# Patient Record
Sex: Male | Born: 1980 | Race: White | Hispanic: No | Marital: Single | Smoking: Never smoker
Health system: Southern US, Community
[De-identification: ages and names within clinical notes are randomized; demographics above are authoritative.]

## PROBLEM LIST (undated history)

## (undated) HISTORY — PX: HERNIA REPAIR: SHX51

---

## 2011-06-06 ENCOUNTER — Emergency Department (HOSPITAL_BASED_OUTPATIENT_CLINIC_OR_DEPARTMENT_OTHER): Payer: BC Managed Care – PPO

## 2011-06-06 ENCOUNTER — Emergency Department (HOSPITAL_BASED_OUTPATIENT_CLINIC_OR_DEPARTMENT_OTHER)
Admission: EM | Admit: 2011-06-06 | Discharge: 2011-06-06 | Disposition: A | Discharge status: Home / Self Care | Payer: BC Managed Care – PPO | Attending: Emergency Medicine | Admitting: Emergency Medicine

## 2011-06-06 ENCOUNTER — Emergency Department (INDEPENDENT_AMBULATORY_CARE_PROVIDER_SITE_OTHER): Payer: BC Managed Care – PPO

## 2011-06-06 ENCOUNTER — Encounter (HOSPITAL_BASED_OUTPATIENT_CLINIC_OR_DEPARTMENT_OTHER): Payer: Self-pay | Admitting: *Deleted

## 2011-06-06 DIAGNOSIS — R42 Dizziness and giddiness: Secondary | ICD-10-CM | POA: Insufficient documentation

## 2011-06-06 DIAGNOSIS — R10819 Abdominal tenderness, unspecified site: Secondary | ICD-10-CM

## 2011-06-06 DIAGNOSIS — R197 Diarrhea, unspecified: Secondary | ICD-10-CM

## 2011-06-06 DIAGNOSIS — R109 Unspecified abdominal pain: Secondary | ICD-10-CM | POA: Insufficient documentation

## 2011-06-06 DIAGNOSIS — R112 Nausea with vomiting, unspecified: Secondary | ICD-10-CM | POA: Insufficient documentation

## 2011-06-06 DIAGNOSIS — R1110 Vomiting, unspecified: Secondary | ICD-10-CM

## 2011-06-06 LAB — URINALYSIS, ROUTINE W REFLEX MICROSCOPIC
Ketones, ur: >80 mg/dL — AB
Nitrite: NEGATIVE
Protein, ur: 100 mg/dL — AB
Urobilinogen, UA: 1 mg/dL (ref 0.0–1.0)

## 2011-06-06 LAB — CBC
HCT: 44.4 % (ref 39.0–52.0)
MCH: 33.4 pg (ref 26.0–34.0)
MCV: 91.5 fL (ref 78.0–100.0)
RBC: 4.85 MIL/uL (ref 4.22–5.81)
WBC: 11.3 10*3/uL — ABNORMAL HIGH (ref 4.0–10.5)

## 2011-06-06 LAB — URINE MICROSCOPIC-ADD ON

## 2011-06-06 LAB — COMPREHENSIVE METABOLIC PANEL
BUN: 22 mg/dL (ref 6–23)
CO2: 22 mEq/L (ref 19–32)
Chloride: 99 mEq/L (ref 96–112)
Creatinine, Ser: 0.9 mg/dL (ref 0.50–1.35)
GFR calc Af Amer: >90 mL/min (ref >90)
GFR calc non Af Amer: >90 mL/min (ref >90)
Total Bilirubin: 3.6 mg/dL — ABNORMAL HIGH (ref 0.3–1.2)

## 2011-06-06 LAB — LIPASE, BLOOD: Lipase: 31 U/L (ref 11–59)

## 2011-06-06 MED ORDER — SODIUM CHLORIDE 0.9 % IV BOLUS (SEPSIS)
2000.0000 mL | Freq: Once | INTRAVENOUS | Status: AC
  Administered 2011-06-06: 1000 mL via INTRAVENOUS

## 2011-06-06 MED ORDER — ONDANSETRON HCL 4 MG PO TABS: 8.0000 mg | ORAL_TABLET | Freq: Four times a day (QID) | ORAL | Status: AC

## 2011-06-06 MED ORDER — ONDANSETRON HCL 4 MG/2ML IJ SOLN
4.0000 mg | Freq: Once | INTRAMUSCULAR | Status: AC
  Administered 2011-06-06: 4 mg via INTRAVENOUS
  Filled 2011-06-06: qty 2

## 2011-06-06 NOTE — Discharge Instructions (Signed)
Diet for Diarrhea, Adult Having frequent, runny stools (diarrhea) has many causes. Diarrhea may be caused or worsened by food or drink. Diarrhea may be relieved by changing your diet. IF YOU ARE NOT TOLERATING SOLID FOODS:  Drink enough water and fluids to keep your urine clear or pale yellow.   Avoid sugary drinks and sodas as well as milk-based beverages.   Avoid beverages containing caffeine and alcohol.   You may try rehydrating beverages. You can make your own by following this recipe:    tsp table salt.    tsp baking soda.   ? tsp salt substitute (potassium chloride).   1 tbs + 1 tsp sugar.   1 qt water.  As your stools become more solid, you can start eating solid foods. Add foods one at a time. If a certain food causes your diarrhea to get worse, avoid that food and try other foods. A low fiber, low-fat, and lactose-free diet is recommended. Small, frequent meals may be better tolerated.  Starches  Allowed:  White, [place], and pita breads, plain rolls, buns, bagels. Plain muffins, matzo. Soda, saltine, or graham crackers. Pretzels, melba toast, zwieback. Cooked cereals made with water: cornmeal, farina, cream cereals. Dry cereals: refined corn, wheat, rice. Potatoes prepared any way without skins, refined macaroni, spaghetti, noodles, refined rice.   Avoid:  Bread, rolls, or crackers made with whole wheat, multi-grains, rye, bran seeds, nuts, or coconut. Corn tortillas or taco shells. Cereals containing whole grains, multi-grains, bran, coconut, nuts, or raisins. Cooked or dry oatmeal. Coarse wheat cereals, granola. Cereals advertised as "high-fiber." Potato skins. Whole grain pasta, wild or brown rice. Popcorn. Sweet potatoes/yams. Sweet rolls, doughnuts, waffles, pancakes, sweet breads.  Vegetables  Allowed: Strained tomato and vegetable juices. Most well-cooked and canned vegetables without seeds. Fresh: Tender lettuce, cucumber without the skin, cabbage, spinach, bean  sprouts.   Avoid: Fresh, cooked, or canned: Artichokes, baked beans, beet greens, broccoli, Brussels sprouts, corn, kale, legumes, peas, sweet potatoes. Cooked: Green or red cabbage, spinach. Avoid large servings of any vegetables, because vegetables shrink when cooked, and they contain more fiber per serving than fresh vegetables.  Fruit  Allowed: All fruit juices except prune juice. Cooked or canned: Apricots, applesauce, cantaloupe, cherries, fruit cocktail, grapefruit, grapes, kiwi, mandarin oranges, peaches, pears, plums, watermelon. Fresh: Apples without skin, ripe banana, grapes, cantaloupe, cherries, grapefruit, peaches, oranges, plums. Keep servings limited to  cup or 1 piece.   Avoid: Fresh: Apple with skin, apricots, mango, pears, raspberries, strawberries. Prune juice, stewed or dried prunes. Dried fruits, raisins, dates. Large servings of all fresh fruits.  Meat and Meat Substitutes  Allowed: Ground or well-cooked tender beef, ham, veal, lamb, pork, or poultry. Eggs, plain cheese. Fish, oysters, shrimp, lobster, other seafoods. Liver, organ meats.   Avoid: Tough, fibrous meats with gristle. Peanut butter, smooth or chunky. Cheese, nuts, seeds, legumes, dried peas, beans, lentils.  Milk  Allowed: Yogurt, lactose-free milk, kefir, drinkable yogurt, buttermilk, soy milk.   Avoid: Milk, chocolate milk, beverages made with milk, such as milk shakes.  Soups  Allowed: Bouillon, broth, or soups made from allowed foods. Any strained soup.   Avoid: Soups made from vegetables that are not allowed, cream or milk-based soups.  Desserts and Sweets  Allowed: Sugar-free gelatin, sugar-free frozen ice pops made without sugar alcohol.   Avoid: Plain cakes and cookies, pie made with allowed fruit, pudding, custard, cream pie. Gelatin, fruit, ice, sherbet, frozen ice pops. Ice cream, ice milk without nuts. Plain hard candy,  honey, jelly, molasses, syrup, sugar, chocolate syrup, gumdrops,  marshmallows.  Fats and Oils  Allowed: Avoid any fats and oils.   Avoid: Seeds, nuts, olives, avocados. Margarine, butter, cream, mayonnaise, salad oils, plain salad dressings made from allowed foods. Plain gravy, crisp bacon without rind.  Beverages  Allowed: Water, decaffeinated teas, oral rehydration solutions, sugar-free beverages.   Avoid: Fruit juices, caffeinated beverages (coffee, tea, soda or pop), alcohol, sports drinks, or lemon-lime soda or pop.  Condiments  Allowed: Ketchup, mustard, horseradish, vinegar, cream sauce, cheese sauce, cocoa powder. Spices in moderation: allspice, basil, bay leaves, celery powder or leaves, cinnamon, cumin powder, curry powder, ginger, mace, marjoram, onion or garlic powder, oregano, paprika, parsley flakes, ground pepper, rosemary, sage, savory, tarragon, thyme, turmeric.   Avoid: Coconut, honey.  Weight Monitoring: Weigh yourself every day. You should weigh yourself in the morning after you urinate and before you eat breakfast. Wear the same amount of clothing when you weigh yourself. Record your weight daily. Bring your recorded weights to your clinic visits. Tell your caregiver right away if you have gained 3 lb/1.4 kg or more in 1 day, 5 lb/2.3 kg in a week, or whatever amount you were told to report. SEEK IMMEDIATE MEDICAL CARE IF:   You are unable to keep fluids down.   You start to throw up (vomit) or diarrhea keeps coming back (persistent).   Abdominal pain develops, increases, or can be felt in one place (localizes).   You have an oral temperature above 102 F (38.9 C), not controlled by medicine.   Diarrhea contains blood or mucus.   You develop excessive weakness, dizziness, fainting, or extreme thirst.  MAKE SURE YOU:   Understand these instructions.   Will watch your condition.   Will get help right away if you are not doing well or get worse.  Document Released: 05/25/2003 Document Revised: 02/21/2011 Document Reviewed:  09/15/2008 [facility] Patient Information 2012 [place], [place].   Your blood sugar today was 144  Which is mildly be elevated. You can take Imodium AD as directed for diarrhea. Call Dr. Clarene Duke to arrange to be seen in the office next week regarding her blood sugar. Return if your condition worsens for any reason

## 2011-06-06 NOTE — ED Notes (Addendum)
Vomiting  And diarrhea x 1 day- took phenergan without relief- pt went to walk-in clinic and had phenergan 50mg  IM given at 1430

## 2011-06-06 NOTE — ED Provider Notes (Addendum)
History     CSN: [id number]  Arrival date & time 06/06/11  1710   First MD Initiated Contact with Patient 06/06/11 1838      Chief Complaint  Patient presents with  . Emesis    (Consider location/radiation/quality/duration/timing/severity/associated sxs/prior treatment) HPI Complains of low abdominnal pain several episodes of vomiting and several episodes of diarrhea onset this morning. No blood per rectum no hematemesis. Abdominal pain is worse with vomiting not improved by anything is mild to moderate present. Treated with Phenergan, no relief. Continues to vomit. Seen at walk-in clinic earlier today sent here for further evaluation. Associated symptoms include lightheadedness. Pain is sharp in quality and also crampy History reviewed. No pertinent past medical history. Past medical history negative Past Surgical History  Procedure Date  . Hernia repair     No family history on file.  History  Substance Use Topics  . Smoking status: Never Smoker   . Smokeless tobacco: Never Used  . Alcohol Use: Yes     rare      Review of Systems  Constitutional: Positive for appetite change and fatigue.  HENT: Negative.   Respiratory: Negative.   Cardiovascular: Negative.   Gastrointestinal: Positive for nausea, vomiting, abdominal pain and diarrhea.  Musculoskeletal: Negative.   Skin: Negative.   Neurological: Negative.   Hematological: Negative.   Psychiatric/Behavioral: Negative.     Allergies  Benadryl and Erythromycin  Home Medications   Current Outpatient Rx  Name Route Sig Dispense Refill  . PROMETHAZINE HCL 25 MG PO TABS Oral Take 25 mg by mouth every 6 (six) hours as needed.      BP 106/77  Pulse 108  Temp(Src) 98.3 F (36.8 C) (Oral)  Resp 20  Ht 6\' 5"  (1.956 m)  Wt 225 lb (102.059 kg)  BMI 26.68 kg/m2  SpO2 98%  Physical Exam  Nursing note and vitals reviewed. Constitutional: He appears well-developed and well-nourished.  HENT:  Head:  Normocephalic and atraumatic.  Eyes: Conjunctivae are normal. Pupils are equal, round, and reactive to light.  Neck: Neck supple. No tracheal deviation present. No thyromegaly present.  Cardiovascular: Normal rate and regular rhythm.   No murmur heard. Pulmonary/Chest: Effort normal and breath sounds normal.  Abdominal: Soft. He exhibits no distension and no mass. There is tenderness. There is rebound. There is no guarding.       Periumbilical tenderness, left lower quadrant tenderness, right lower quadrant tenderness  Genitourinary: Penis normal.  Musculoskeletal: Normal range of motion. He exhibits no edema and no tenderness.  Neurological: He is alert. Coordination normal.  Skin: Skin is warm and dry. No rash noted.  Psychiatric: He has a normal mood and affect.   Results for orders placed during the hospital encounter of 06/06/11  CBC      Component Value Range   WBC 11.3 (*) 4.0 - 10.5 (K/uL)   RBC 4.85  4.22 - 5.81 (MIL/uL)   Hemoglobin 16.2  13.0 - 17.0 (g/dL)   HCT 40.9  81.1 - 91.4 (%)   MCV 91.5  78.0 - 100.0 (fL)   MCH 33.4  26.0 - 34.0 (pg)   MCHC 36.5 (*) 30.0 - 36.0 (g/dL)   RDW 78.2  95.6 - 21.3 (%)   Platelets 246  150 - 400 (K/uL)  COMPREHENSIVE METABOLIC PANEL      Component Value Range   Sodium 138  135 - 145 (mEq/L)   Potassium 4.0  3.5 - 5.1 (mEq/L)   Chloride 99  96 - 112 (  mEq/L)   CO2 22  19 - 32 (mEq/L)   Glucose, Bld 144 (*) 70 - 99 (mg/dL)   BUN 22  6 - 23 (mg/dL)   Creatinine, Ser 1.61  0.50 - 1.35 (mg/dL)   Calcium 09.6 (*) 8.4 - 10.5 (mg/dL)   Total Protein 8.7 (*) 6.0 - 8.3 (g/dL)   Albumin 5.1  3.5 - 5.2 (g/dL)   AST 27  0 - 37 (U/L)   ALT 33  0 - 53 (U/L)   Alkaline Phosphatase 94  39 - 117 (U/L)   Total Bilirubin 3.6 (*) 0.3 - 1.2 (mg/dL)   GFR calc non Af Amer >90  >90 (mL/min)   GFR calc Af Amer >90  >90 (mL/min)  LIPASE, BLOOD      Component Value Range   Lipase 31  11 - 59 (U/L)  URINALYSIS, ROUTINE W REFLEX MICROSCOPIC       Component Value Range   Color, Urine ORANGE (*) YELLOW    APPearance CLOUDY (*) CLEAR    Specific Gravity, Urine 1.036 (*) 1.005 - 1.030    pH 6.0  5.0 - 8.0    Glucose, UA NEGATIVE  NEGATIVE (mg/dL)   Hgb urine dipstick NEGATIVE  NEGATIVE    Bilirubin Urine MODERATE (*) NEGATIVE    Ketones, ur >80 (*) NEGATIVE (mg/dL)   Protein, ur 045 (*) NEGATIVE (mg/dL)   Urobilinogen, UA 1.0  0.0 - 1.0 (mg/dL)   Nitrite NEGATIVE  NEGATIVE    Leukocytes, UA TRACE (*) NEGATIVE   URINE MICROSCOPIC-ADD ON      Component Value Range   Squamous Epithelial / LPF RARE  RARE    WBC, UA 0-2  <3 (WBC/hpf)   Bacteria, UA RARE  RARE    Urine-Other MUCOUS PRESENT     Dg Abd Acute W/chest  06/06/2011  *RADIOLOGY REPORT*  Clinical Data: Lower abdominal tenderness, vomiting, diarrhea  ACUTE ABDOMEN SERIES (ABDOMEN 2 VIEW & CHEST 1 VIEW)  Comparison: None  Findings: Normal heart size, mediastinal contours, and pulmonary vascularity. Lungs clear. Scattered radiodense material within small bowel loops. No bowel dilatation, bowel wall thickening or free intraperitoneal air. Bones unremarkable. No urinary tract calcifications identified. Sclerotic focus potentially bone island right ilium 15 x 9 mm.  IMPRESSION: Mildly radiodense material within small bowel loops, nonspecific, could potentially represent radiodense medication or dilute contrast ingestion. No acute abnormalities.  Original Report Authenticated By: Lollie Marrow, M.D.     ED Course  Procedures (including critical care time) Declines pain medicine presently requesting antiemetic. Zofran & IV fluids ordered Labs Reviewed - No data to display No results found. He feels much improved after treatment with intravenous fluids and Zofran  No diagnosis found.    MDM  CT scan initially ordered of abdomen pelvis was canceled at patient's request. I am in agreement as my suspicion for appendicitis is low. Patient lives close by and can return if his pain  worsens. Note that radiodensity are seen on abdominal films is oral contrast as patient had begun to drink oral contrast for his CT scan Symptoms most likely consistent with gastroenteritis Plan prescriptions Zofran Imodium ad Patient is encouraged to followup with Dr. Clarene Duke regarding hyperglycemia Diagnosis #1 nausea vomiting diarrhea #2 dehydration #3 hyperglycemia       Doug Sou, MD 06/06/11 4098  Doug Sou, MD 06/07/11 0040

## 2011-06-06 NOTE — ED Notes (Signed)
Pt states his nausea has subsided and his abdominal discomfort is resolved. Pt states he does not think he needs to have a CT, and is requesting the EDP be notified.

## 2011-06-06 NOTE — ED Notes (Signed)
DR Felix Pacini at bedside to speak with pt

## 2012-10-25 IMAGING — CR DG ABDOMEN ACUTE W/ 1V CHEST
4 series · 4 of 4 positions shown · non-contrast
Comparison: None

CLINICAL DATA: Lower abdominal tenderness, vomiting, diarrhea

ACUTE ABDOMEN SERIES (ABDOMEN 2 VIEW & CHEST 1 VIEW)

[w chest pa]
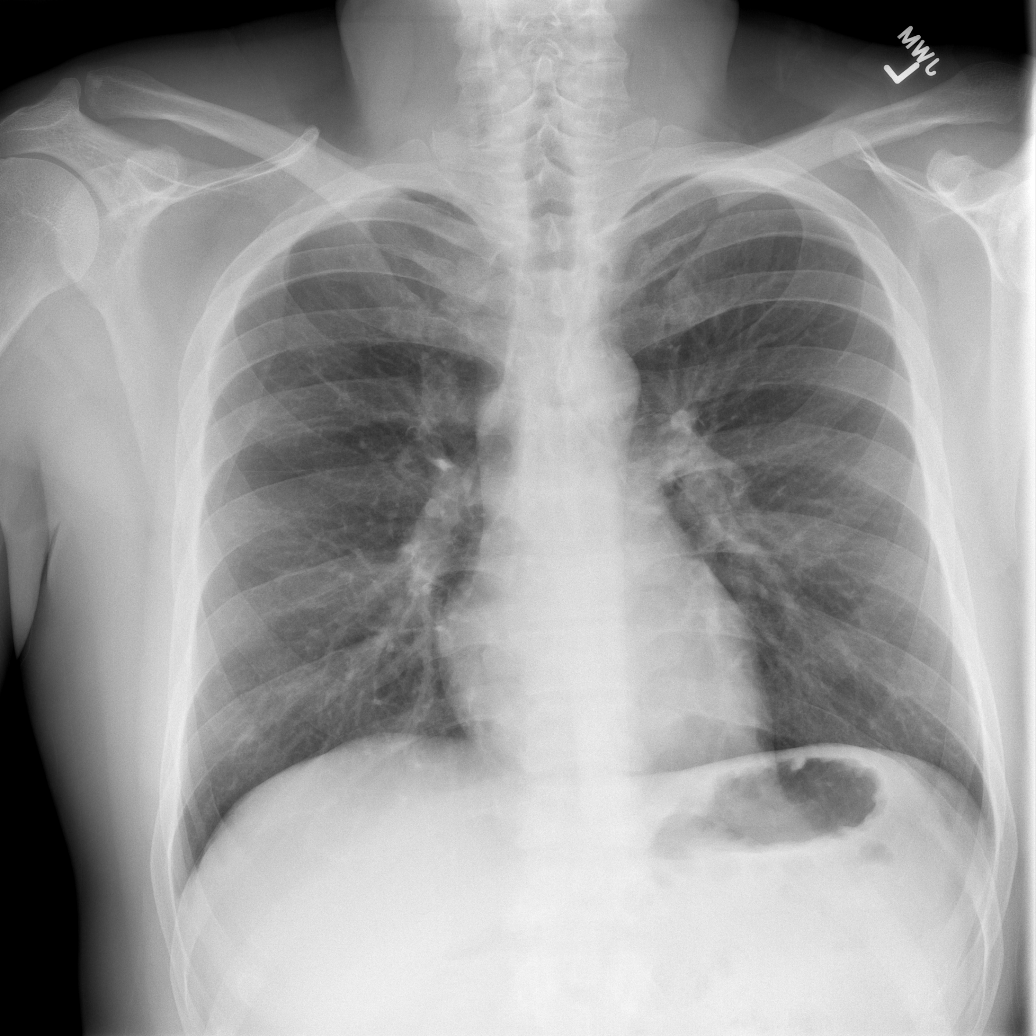

[w abdomen upright]
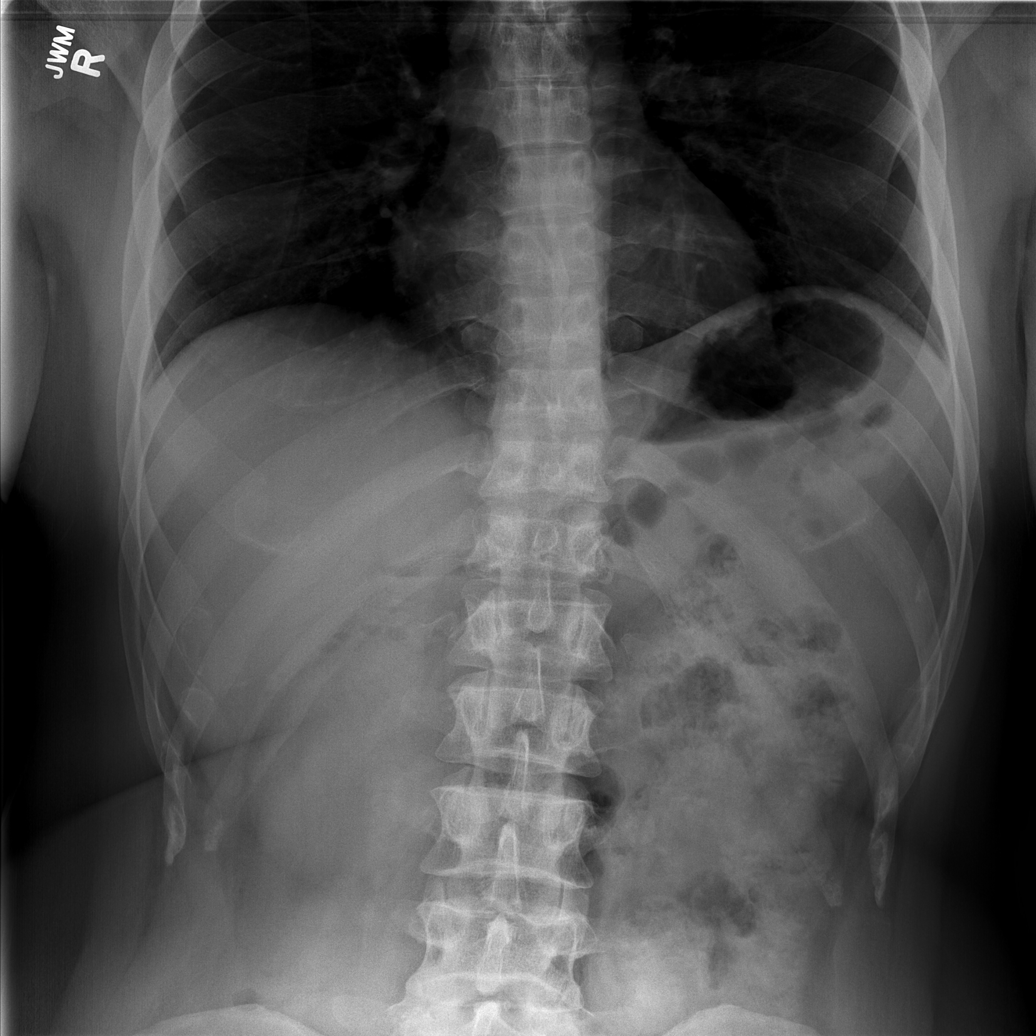

[t abdomen supine (1 of 2)]
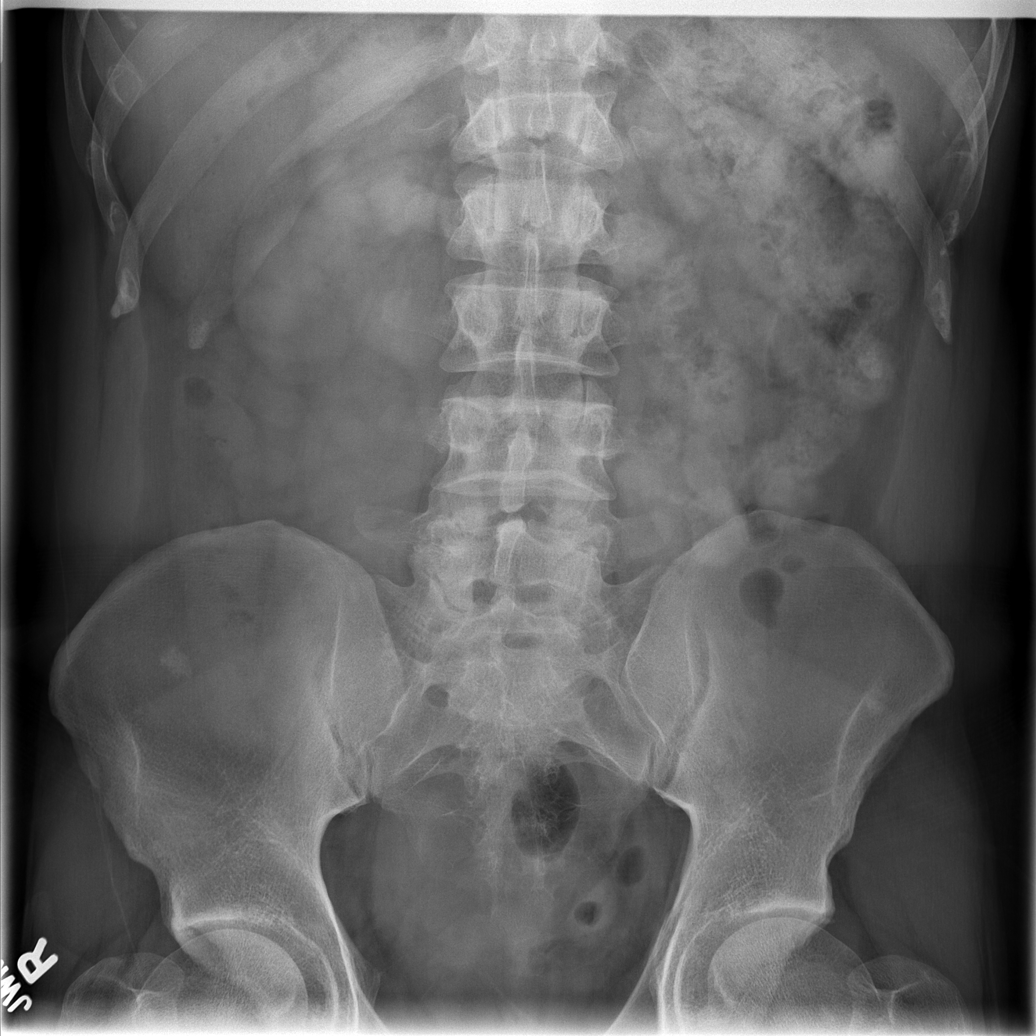

[t abdomen supine (2 of 2)]
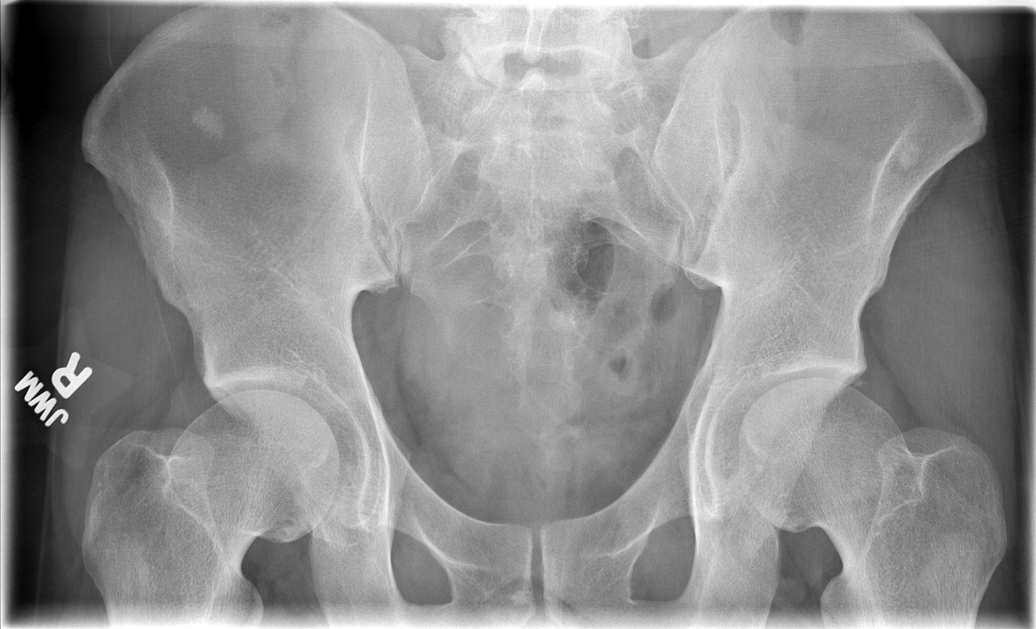

[4 of 4 positions shown; findings below may reference images not displayed]

FINDINGS: Normal heart size, mediastinal contours, and pulmonary vascularity.
Lungs clear.
Scattered radiodense material within small bowel loops.
No bowel dilatation, bowel wall thickening or free intraperitoneal
air.
Bones unremarkable.
No urinary tract calcifications identified.
Sclerotic focus potentially bone island right ilium 15 x 9 mm.
IMPRESSION: Mildly radiodense material within small bowel loops, nonspecific,
could potentially represent radiodense medication or dilute
contrast ingestion.
No acute abnormalities.

## 2014-07-11 ENCOUNTER — Encounter (HOSPITAL_BASED_OUTPATIENT_CLINIC_OR_DEPARTMENT_OTHER): Payer: Self-pay | Admitting: *Deleted

## 2014-07-11 ENCOUNTER — Emergency Department (HOSPITAL_BASED_OUTPATIENT_CLINIC_OR_DEPARTMENT_OTHER)
Admission: EM | Admit: 2014-07-11 | Discharge: 2014-07-11 | Disposition: A | Discharge status: Home / Self Care | Payer: 59 | Attending: Emergency Medicine | Admitting: Emergency Medicine

## 2014-07-11 DIAGNOSIS — R531 Weakness: Secondary | ICD-10-CM | POA: Insufficient documentation

## 2014-07-11 DIAGNOSIS — K529 Noninfective gastroenteritis and colitis, unspecified: Secondary | ICD-10-CM | POA: Diagnosis not present

## 2014-07-11 DIAGNOSIS — R5383 Other fatigue: Secondary | ICD-10-CM | POA: Diagnosis not present

## 2014-07-11 DIAGNOSIS — R112 Nausea with vomiting, unspecified: Secondary | ICD-10-CM | POA: Diagnosis present

## 2014-07-11 LAB — COMPREHENSIVE METABOLIC PANEL
ALBUMIN: 4.6 g/dL (ref 3.5–5.2)
ALT: 45 U/L (ref 0–53)
AST: 33 U/L (ref 0–37)
Alkaline Phosphatase: 77 U/L (ref 39–117)
Anion gap: 9 (ref 5–15)
BILIRUBIN TOTAL: 2.7 mg/dL — AB (ref 0.3–1.2)
BUN: 25 mg/dL — ABNORMAL HIGH (ref 6–23)
CO2: 25 mmol/L (ref 19–32)
CREATININE: 1.09 mg/dL (ref 0.50–1.35)
Calcium: 9.5 mg/dL (ref 8.4–10.5)
Chloride: 105 mmol/L (ref 96–112)
GFR calc Af Amer: >90 mL/min (ref >90)
GFR calc non Af Amer: 87 mL/min — ABNORMAL LOW (ref >90)
Glucose, Bld: 136 mg/dL — ABNORMAL HIGH (ref 70–99)
Potassium: 3.9 mmol/L (ref 3.5–5.1)
SODIUM: 139 mmol/L (ref 135–145)
TOTAL PROTEIN: 8.4 g/dL — AB (ref 6.0–8.3)

## 2014-07-11 LAB — CBC WITH DIFFERENTIAL/PLATELET
BASOS ABS: 0 10*3/uL (ref 0.0–0.1)
Basophils Relative: 0 % (ref 0–1)
EOS ABS: 0 10*3/uL (ref 0.0–0.7)
EOS PCT: 0 % (ref 0–5)
HEMATOCRIT: 45.4 % (ref 39.0–52.0)
Hemoglobin: 16 g/dL (ref 13.0–17.0)
LYMPHS ABS: 0.4 10*3/uL — AB (ref 0.7–4.0)
LYMPHS PCT: 4 % — AB (ref 12–46)
MCH: 33.3 pg (ref 26.0–34.0)
MCHC: 35.2 g/dL (ref 30.0–36.0)
MCV: 94.4 fL (ref 78.0–100.0)
MONOS PCT: 7 % (ref 3–12)
Monocytes Absolute: 0.8 10*3/uL (ref 0.1–1.0)
NEUTROS PCT: 89 % — AB (ref 43–77)
Neutro Abs: 10.1 10*3/uL — ABNORMAL HIGH (ref 1.7–7.7)
Platelets: 215 10*3/uL (ref 150–400)
RBC: 4.81 MIL/uL (ref 4.22–5.81)
RDW: 13.3 % (ref 11.5–15.5)
WBC: 11.3 10*3/uL — AB (ref 4.0–10.5)

## 2014-07-11 LAB — URINE MICROSCOPIC-ADD ON

## 2014-07-11 LAB — URINALYSIS, ROUTINE W REFLEX MICROSCOPIC
GLUCOSE, UA: NEGATIVE mg/dL
Hgb urine dipstick: NEGATIVE
KETONES UR: 40 mg/dL — AB
Leukocytes, UA: NEGATIVE
Nitrite: NEGATIVE
PROTEIN: 30 mg/dL — AB
Specific Gravity, Urine: 1.028 (ref 1.005–1.030)
Urobilinogen, UA: 0.2 mg/dL (ref 0.0–1.0)
pH: 6 (ref 5.0–8.0)

## 2014-07-11 MED ORDER — ONDANSETRON HCL 4 MG/2ML IJ SOLN
4.0000 mg | Freq: Once | INTRAMUSCULAR | Status: DC
  Filled 2014-07-11: qty 2

## 2014-07-11 MED ORDER — SODIUM CHLORIDE 0.9 % IV BOLUS (SEPSIS)
1000.0000 mL | Freq: Once | INTRAVENOUS | Status: AC
  Administered 2014-07-11: 1000 mL via INTRAVENOUS

## 2014-07-11 MED ORDER — ONDANSETRON HCL 4 MG/2ML IJ SOLN
4.0000 mg | Freq: Once | INTRAMUSCULAR | Status: AC
  Administered 2014-07-11: 4 mg via INTRAVENOUS

## 2014-07-11 MED ORDER — ONDANSETRON HCL 8 MG PO TABS: 8.0000 mg | ORAL_TABLET | Freq: Three times a day (TID) | ORAL | Status: AC | PRN

## 2014-07-11 NOTE — Discharge Instructions (Signed)

## 2014-07-11 NOTE — ED Notes (Signed)
Vomiting since 8am. He had diarrhea initially. States he feels dehydrated like he is going to pass out. Drove himself here.

## 2014-07-13 NOTE — ED Provider Notes (Signed)
CSN: [id number]     Arrival date & time 07/11/14  1153 History   First MD Initiated Contact with Patient 07/11/14 1347     Chief Complaint  Patient presents with  . Emesis     (Consider location/radiation/quality/duration/timing/severity/associated sxs/prior Treatment) The history is provided by the patient.   Darryl SiasJustin Drewry is a 34 y.o. male otherwise healthy patient, presenting with nausea and vomiting starting around 8 am today.  He states his entire family have had this GI bug, lasting about 2 days.  He reports multiple episodes of non bloody emesis along with watery diarrhea and has been unable to tolerate PO fluids.  He denies abdominal pain except for cramping prior to bms.  He feels weak and lightheaded when he stands so knows he is dehydrated.  He denies fevers, chills, headache, chest pain or shortness of breath.  He has had no treatments prior to arrival.     History reviewed. No pertinent past medical history. Past Surgical History  Procedure Laterality Date  . Hernia repair     No family history on file. History  Substance Use Topics  . Smoking status: Never Smoker   . Smokeless tobacco: Never Used  . Alcohol Use: Yes     Comment: rare    Review of Systems  Constitutional: Positive for fatigue. Negative for fever and chills.  HENT: Negative for congestion and sore throat.   Eyes: Negative.   Respiratory: Negative for chest tightness and shortness of breath.   Cardiovascular: Negative for chest pain.  Gastrointestinal: Positive for nausea, vomiting and diarrhea. Negative for abdominal pain.  Genitourinary: Negative.   Musculoskeletal: Negative for joint swelling, arthralgias and neck pain.  Skin: Negative.  Negative for rash and wound.  Neurological: Positive for weakness. Negative for dizziness, light-headedness, numbness and headaches.  Psychiatric/Behavioral: Negative.       Allergies  Benadryl and Erythromycin  Home Medications   Prior to Admission  medications   Medication Sig Start Date End Date Taking? Authorizing Provider  ondansetron (ZOFRAN) 8 MG tablet Take 1 tablet (8 mg total) by mouth every 8 (eight) hours as needed for nausea or vomiting. 07/11/14   Burgess AmorJulie Nathin Saran, PA-C  promethazine (PHENERGAN) 25 MG tablet Take 25 mg by mouth every 6 (six) hours as needed.    Historical Provider, MD   BP 119/77 mmHg  Pulse 78  Temp(Src) 98.5 F (36.9 C) (Oral)  Resp 18  Ht 6\' 5"  (1.956 m)  Wt 225 lb (102.059 kg)  BMI 26.68 kg/m2  SpO2 97% Physical Exam  Constitutional: He appears well-developed and well-nourished.  HENT:  Head: Normocephalic and atraumatic.  Mouth/Throat: Oropharynx is clear and moist.  Eyes: Conjunctivae are normal.  Neck: Normal range of motion.  Cardiovascular: Normal rate, regular rhythm, normal heart sounds and intact distal pulses.   Pulmonary/Chest: Effort normal and breath sounds normal. He has no wheezes.  Abdominal: Soft. Bowel sounds are normal. He exhibits no distension. There is no tenderness. There is no rebound and no guarding.  Musculoskeletal: Normal range of motion.  Neurological: He is alert.  Skin: Skin is warm and dry.  Psychiatric: He has a normal mood and affect.  Nursing note and vitals reviewed.   ED Course  Procedures (including critical care time) Labs Review Labs Reviewed  URINALYSIS, ROUTINE W REFLEX MICROSCOPIC - Abnormal; Notable for the following:    Color, Urine AMBER (*)    APPearance CLOUDY (*)    Bilirubin Urine SMALL (*)    Ketones, ur  40 (*)    Protein, ur 30 (*)    All other components within normal limits  CBC WITH DIFFERENTIAL/PLATELET - Abnormal; Notable for the following:    WBC 11.3 (*)    Neutrophils Relative % 89 (*)    Neutro Abs 10.1 (*)    Lymphocytes Relative 4 (*)    Lymphs Abs 0.4 (*)    All other components within normal limits  COMPREHENSIVE METABOLIC PANEL - Abnormal; Notable for the following:    Glucose, Bld 136 (*)    BUN 25 (*)    Total Protein  8.4 (*)    Total Bilirubin 2.7 (*)    GFR calc non Af Amer 87 (*)    All other components within normal limits  URINE MICROSCOPIC-ADD ON - Abnormal; Notable for the following:    Squamous Epithelial / LPF FEW (*)    All other components within normal limits    Imaging Review No results found.   EKG Interpretation None      MDM   Final diagnoses:  Gastroenteritis    Patients labs and/or radiological studies were reviewed and considered during the medical decision making and disposition process.  Results were also discussed with patient. Pt was given IV fluids, zofran and was able to tolerate PO fluids.  He had no diarrhea while here.  Reports feeling much better at time of dc.  Prescribed zofran, encouraged increased fluid intake, blank diet as tolerated, recheck if sx not better over the next 48 hours.    Burgess Amor, PA-C 07/13/14 2147  Vanetta Mulders, MD 07/14/14 [phone]

## 2016-03-17 ENCOUNTER — Encounter (HOSPITAL_BASED_OUTPATIENT_CLINIC_OR_DEPARTMENT_OTHER): Payer: Self-pay | Admitting: *Deleted

## 2016-03-17 ENCOUNTER — Emergency Department (HOSPITAL_BASED_OUTPATIENT_CLINIC_OR_DEPARTMENT_OTHER)
Admission: EM | Admit: 2016-03-17 | Discharge: 2016-03-17 | Disposition: A | Discharge status: Home / Self Care | Payer: BLUE CROSS/BLUE SHIELD | Attending: Emergency Medicine | Admitting: Emergency Medicine

## 2016-03-17 DIAGNOSIS — K529 Noninfective gastroenteritis and colitis, unspecified: Secondary | ICD-10-CM | POA: Insufficient documentation

## 2016-03-17 DIAGNOSIS — R197 Diarrhea, unspecified: Secondary | ICD-10-CM | POA: Diagnosis present

## 2016-03-17 MED ORDER — PROMETHAZINE HCL 25 MG PO TABS: 25.0000 mg | ORAL_TABLET | Freq: Four times a day (QID) | ORAL | 0 refills | Status: AC | PRN

## 2016-03-17 MED ORDER — DIPHENOXYLATE-ATROPINE 2.5-0.025 MG PO TABS
2.0000 | ORAL_TABLET | Freq: Once | ORAL | Status: AC
  Administered 2016-03-17: 2 via ORAL
  Filled 2016-03-17: qty 2

## 2016-03-17 MED ORDER — SODIUM CHLORIDE 0.9 % IV SOLN
8.0000 mg | Freq: Once | INTRAVENOUS | Status: AC
  Administered 2016-03-17: 8 mg via INTRAVENOUS
  Filled 2016-03-17: qty 4

## 2016-03-17 MED ORDER — DICYCLOMINE HCL 10 MG/ML IM SOLN
INTRAMUSCULAR | Status: AC
Start: 2016-03-17 — End: 2016-03-17
  Administered 2016-03-17: 20 mg via INTRAMUSCULAR
  Filled 2016-03-17: qty 2

## 2016-03-17 MED ORDER — ONDANSETRON HCL 4 MG/2ML IJ SOLN
INTRAMUSCULAR | Status: AC
  Filled 2016-03-17: qty 4

## 2016-03-17 MED ORDER — GLYCOPYRROLATE 0.2 MG/ML IJ SOLN
0.2000 mg | Freq: Once | INTRAMUSCULAR | Status: DC
  Filled 2016-03-17: qty 1

## 2016-03-17 MED ORDER — SODIUM CHLORIDE 0.9 % IV BOLUS (SEPSIS)
1000.0000 mL | Freq: Once | INTRAVENOUS | Status: AC
  Administered 2016-03-17: 1000 mL via INTRAVENOUS

## 2016-03-17 MED ORDER — DICYCLOMINE HCL 10 MG/ML IM SOLN
20.0000 mg | Freq: Once | INTRAMUSCULAR | Status: AC
  Administered 2016-03-17: 20 mg via INTRAMUSCULAR

## 2016-03-17 MED ORDER — DICYCLOMINE HCL 20 MG PO TABS: 20.0000 mg | ORAL_TABLET | Freq: Two times a day (BID) | ORAL | 0 refills | Status: AC

## 2016-03-17 MED ORDER — DIPHENOXYLATE-ATROPINE 2.5-0.025 MG PO TABS: 1.0000 | ORAL_TABLET | Freq: Four times a day (QID) | ORAL | 0 refills | Status: AC | PRN

## 2016-03-17 MED ORDER — PROMETHAZINE HCL 25 MG/ML IJ SOLN
12.5000 mg | Freq: Once | INTRAMUSCULAR | Status: AC
  Administered 2016-03-17: 12.5 mg via INTRAVENOUS
  Filled 2016-03-17: qty 1

## 2016-03-17 MED ORDER — MORPHINE SULFATE (PF) 4 MG/ML IV SOLN
4.0000 mg | Freq: Once | INTRAVENOUS | Status: AC
  Administered 2016-03-17: 4 mg via INTRAVENOUS
  Filled 2016-03-17: qty 1

## 2016-03-17 NOTE — ED Notes (Signed)
ED Provider at bedside. 

## 2016-03-17 NOTE — ED Provider Notes (Signed)
[facility] Note   CSN: [id number] Arrival date & time: 03/17/16  0749     History   Chief Complaint Chief Complaint  Patient presents with  . Diarrhea    HPI Darryl Dickson is a 35 y.o. male. He presents for evaluation of diarrhea.  HPI:  Patient with no prior food allergies. He tripped last night previously had abdominal cramping and diarrhea then developed nausea and vomiting. Had vomiting and diarrhea all night long. He feels weak and lightheaded and presents here with continued complaint of abdominal cramps. No blood. No fever. No symptoms prior to the meal.  History reviewed. No pertinent past medical history.  There are no active problems to display for this patient.   Past Surgical History:  Procedure Laterality Date  . HERNIA REPAIR         Home Medications    Prior to Admission medications   Medication Sig Start Date End Date Taking? Authorizing Provider  dicyclomine (BENTYL) 20 MG tablet Take 1 tablet (20 mg total) by mouth 2 (two) times daily. 03/17/16   Rolland PorterMark Daun Rens, MD  diphenoxylate-atropine (LOMOTIL) 2.5-0.025 MG tablet Take 1 tablet by mouth 4 (four) times daily as needed for diarrhea or loose stools. 03/17/16   Rolland PorterMark Yulitza Shorts, MD  ondansetron (ZOFRAN) 8 MG tablet Take 1 tablet (8 mg total) by mouth every 8 (eight) hours as needed for nausea or vomiting. 07/11/14   Burgess AmorJulie Idol, PA-C  promethazine (PHENERGAN) 25 MG tablet Take 1 tablet (25 mg total) by mouth every 6 (six) hours as needed for nausea or vomiting. 03/17/16   Rolland PorterMark Dorcas Melito, MD    Family History History reviewed. No pertinent family history.  Social History Social History  Substance Use Topics  . Smoking status: Never Smoker  . Smokeless tobacco: Never Used  . Alcohol use Yes     Comment: rare     Allergies   Benadryl [diphenhydramine hcl] and Erythromycin   Review of Systems Review of Systems  Constitutional: Negative for appetite change, chills, diaphoresis,  fatigue and fever.  HENT: Negative for mouth sores, sore throat and trouble swallowing.   Eyes: Negative for visual disturbance.  Respiratory: Negative for cough, chest tightness, shortness of breath and wheezing.   Cardiovascular: Negative for chest pain.  Gastrointestinal: Positive for abdominal pain, diarrhea, nausea and vomiting. Negative for abdominal distention.  Endocrine: Negative for polydipsia, polyphagia and polyuria.  Genitourinary: Negative for dysuria, frequency and hematuria.  Musculoskeletal: Negative for gait problem.  Skin: Negative for color change, pallor and rash.  Neurological: Negative for dizziness, syncope, light-headedness and headaches.  Hematological: Does not bruise/bleed easily.  Psychiatric/Behavioral: Negative for behavioral problems and confusion.     Physical Exam Updated Vital Signs BP 121/71 (BP Location: Left Arm)   Pulse 74   Temp 98.4 F (36.9 C) (Oral)   Resp 18   SpO2 98%   Physical Exam  Constitutional: He is oriented to person, place, and time. He appears well-developed and well-nourished. No distress.  HENT:  Head: Normocephalic.  Eyes: Conjunctivae are normal. Pupils are equal, round, and reactive to light. No scleral icterus.  Neck: Normal range of motion. Neck supple. No thyromegaly present.  Cardiovascular: Normal rate and regular rhythm.  Exam reveals no gallop and no friction rub.   No murmur heard. Pulmonary/Chest: Effort normal and breath sounds normal. No respiratory distress. He has no wheezes. He has no rales.  Abdominal: Soft. Bowel sounds are normal. He exhibits no distension. There is no tenderness.  There is no rebound.  Hyperactive bowel sounds. No guarding or rebound.  Musculoskeletal: Normal range of motion.  Neurological: He is alert and oriented to person, place, and time.  Skin: Skin is warm and dry. No rash noted.  Psychiatric: He has a normal mood and affect. His behavior is normal.     ED Treatments /  Results  Labs (all labs ordered are listed, but only abnormal results are displayed) Labs Reviewed - No data to display  EKG  EKG Interpretation None       Radiology No results found.  Procedures Procedures (including critical care time)  Medications Ordered in ED Medications  glycopyrrolate (ROBINUL) injection 0.2 mg (0.2 mg Intravenous Not Given 03/17/16 0827)  ondansetron (ZOFRAN) 4 MG/2ML injection (not administered)  diphenoxylate-atropine (LOMOTIL) 2.5-0.025 MG per tablet 2 tablet (2 tablets Oral Given 03/17/16 0825)  ondansetron (ZOFRAN) 8 mg in sodium chloride 0.9 % 50 mL IVPB (8 mg Intravenous Given 03/17/16 0825)  sodium chloride 0.9 % bolus 1,000 mL (1,000 mLs Intravenous New Bag/Given 03/17/16 0825)  dicyclomine (BENTYL) injection 20 mg (20 mg Intramuscular Given 03/17/16 0837)  promethazine (PHENERGAN) injection 12.5 mg (12.5 mg Intravenous Given 03/17/16 1038)  morphine 4 MG/ML injection 4 mg (4 mg Intravenous Given 03/17/16 1038)     Initial Impression / Assessment and Plan / ED Course  I have reviewed the triage vital signs and the nursing notes.  Pertinent labs & imaging results that were available during my care of the patient were reviewed by me and considered in my medical decision making (see chart for details).  Clinical Course     Plan IV fluids, Bentyl, Zofran, Lomotil. We'll reassess.  Final Clinical Impressions(s) / ED Diagnoses   Final diagnoses:  Gastroenteritis   Ultimately in Phenergan and morphine 4 with second liter fluid. Feeling better. Discharged home. Bentyl, Phenergan, Lomotil. Clear liquids. Recheck as needed.  New Prescriptions New Prescriptions   DICYCLOMINE (BENTYL) 20 MG TABLET    Take 1 tablet (20 mg total) by mouth 2 (two) times daily.   DIPHENOXYLATE-ATROPINE (LOMOTIL) 2.5-0.025 MG TABLET    Take 1 tablet by mouth 4 (four) times daily as needed for diarrhea or loose stools.   PROMETHAZINE (PHENERGAN) 25 MG TABLET    Take  1 tablet (25 mg total) by mouth every 6 (six) hours as needed for nausea or vomiting.     Rolland PorterMark Annisten [place], MD 03/17/16 [phone]

## 2016-03-17 NOTE — ED Notes (Signed)
Pt assisted to call for his ride home.

## 2016-03-17 NOTE — Discharge Instructions (Signed)
Phenergan for nausea. Lomotil for diarrhea. Bentyl for cramps.  Liquid diet today. Slowly advance your diet as your symptoms allow.

## 2016-03-17 NOTE — ED Triage Notes (Signed)
Pt reports sudden onset of diarrhea, cramping and nausea after eating shrimp for dinner last night around 7:30pm. Denies fevers, states he took 3 tums and a zofran, which gave him some relief.
